# Patient Record
Sex: Female | Born: 2005 | Hispanic: No | Marital: Single | Smoking: Never smoker
Health system: Southern US, Community
[De-identification: ages and names within clinical notes are randomized; demographics above are authoritative.]

## PROBLEM LIST (undated history)

## (undated) DIAGNOSIS — R6250 Unspecified lack of expected normal physiological development in childhood: Secondary | ICD-10-CM

## (undated) DIAGNOSIS — Z91018 Allergy to other foods: Secondary | ICD-10-CM

## (undated) DIAGNOSIS — J309 Allergic rhinitis, unspecified: Secondary | ICD-10-CM

## (undated) DIAGNOSIS — J45909 Unspecified asthma, uncomplicated: Secondary | ICD-10-CM

## (undated) DIAGNOSIS — L209 Atopic dermatitis, unspecified: Secondary | ICD-10-CM

## (undated) HISTORY — DX: Unspecified lack of expected normal physiological development in childhood: R62.50

## (undated) HISTORY — DX: Allergy to other foods: Z91.018

## (undated) HISTORY — DX: Allergic rhinitis, unspecified: J30.9

## (undated) HISTORY — DX: Unspecified asthma, uncomplicated: J45.909

## (undated) HISTORY — DX: Atopic dermatitis, unspecified: L20.9

---
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
## 2023-04-12 ENCOUNTER — Ambulatory Visit: Payer: Self-pay | Admitting: Pediatrics

## 2023-04-18 ENCOUNTER — Other Ambulatory Visit: Payer: Self-pay | Admitting: Family

## 2023-04-18 ENCOUNTER — Telehealth: Payer: Self-pay | Admitting: *Deleted

## 2023-04-18 MED ORDER — LISDEXAMFETAMINE DIMESYLATE 30 MG PO CAPS: 30.0000 mg | ORAL_CAPSULE | Freq: Every day | ORAL | 0 refills | Status: DC

## 2023-04-18 NOTE — Telephone Encounter (Signed)
Mom said they had changed to Adderal because Vyvanse was out of stock.

## 2023-04-18 NOTE — Telephone Encounter (Signed)
Glender's mother is requesting refill for Vyvanse. Video Appointment made with Neysa Bonito for [facility] 04/25/23.

## 2023-04-22 ENCOUNTER — Ambulatory Visit: Payer: Self-pay | Admitting: Pediatrics

## 2023-04-25 ENCOUNTER — Telehealth: Payer: Medicaid Other | Admitting: Family

## 2023-04-25 ENCOUNTER — Encounter: Payer: Self-pay | Admitting: Family

## 2023-04-25 DIAGNOSIS — F840 Autistic disorder: Secondary | ICD-10-CM

## 2023-04-25 DIAGNOSIS — F902 Attention-deficit hyperactivity disorder, combined type: Secondary | ICD-10-CM | POA: Diagnosis not present

## 2023-04-25 MED ORDER — AMPHETAMINE-DEXTROAMPHETAMINE 5 MG PO TABS: ORAL_TABLET | ORAL | 0 refills | Status: DC

## 2023-04-25 MED ORDER — AMPHETAMINE-DEXTROAMPHETAMINE 20 MG PO TABS: 20.0000 mg | ORAL_TABLET | Freq: Every day | ORAL | 0 refills | Status: DC

## 2023-04-25 NOTE — Progress Notes (Signed)
THIS RECORD MAY CONTAIN CONFIDENTIAL INFORMATION THAT SHOULD NOT BE RELEASED WITHOUT REVIEW OF THE SERVICE PROVIDER.  Virtual Follow-Up Visit via Video Note  I connected with Brianna [name] and guardian  on 04/25/23 at  9:00 AM EDT by a video enabled telemedicine application and verified that I am speaking with the correct person using two identifiers.   Patient/parent location: home Provider location: [facility] office    I discussed the limitations of evaluation and management by telemedicine and the availability of in person appointments.  I discussed that the purpose of this telehealth visit is to provide medical care while limiting exposure to the novel coronavirus.  The guardian expressed understanding and agreed to proceed.   Brianna [name] is a 17 y.o. female referred by [name], MD here today for follow-up of ADHD.   History was provided by the patient and legal guardian.  Supervising Physician: Dr. Theadore Nan   Plan from Last Visit:     1. ADHD (attention deficit hyperactivity disorder), combined type -continue with Adderall 10 mg  -PHQSADS and ASRS at next check-in -3 months or sooner if needed  Chief Complaint: ADHD  History of Present Illness:  -going to [facility] in Fall  -has the report but can't get it to [place] because it is not a great copy  -LMP end of May  -thinks medication is working but maybe not enough  -something in the report about her sleep  -likes the Adderall over the Vyvanse, feels it works better  -  Allergies  Allergen Reactions   Other Anaphylaxis    Tree Nuts   Shellfish-Derived Products Anaphylaxis   Egg-Derived Products    Fish Allergy    Peanut Butter Flavor    Red Dye    Shellfish Allergy    Outpatient Medications Prior to Visit  Medication Sig Dispense Refill   amphetamine-dextroamphetamine (ADDERALL) 10 MG tablet Take 1 tablet (10 mg total) by mouth daily with breakfast. 30 tablet 0   cetirizine (ZYRTEC ALLERGY) 10 MG tablet  Take 1 tablet (10 mg total) by mouth daily as needed for allergies (Can take an exyra dose during flare ups.). 60 tablet 11   CETIRIZINE HCL CHILDRENS ALRGY 1 MG/ML SOLN TAKE 1 TO 2 TEASPOONSFUL BY MOUTH EVERY DAY AS NEEDED 300 mL 0   cromolyn (OPTICROM) 4 % ophthalmic solution Place 1 drop into both eyes 4 (four) times daily as needed. 10 mL 5   EPINEPHrine 0.3 mg/0.3 mL IJ SOAJ injection Inject 0.3 mg into the muscle as needed for anaphylaxis. Use as directed for life-threatening allergic reactions. 4 each 2   fluticasone (FLONASE) 50 MCG/ACT nasal spray USE 1 SPRAY IN EACH NOSTRIL 3-7 TIMES A WEEK AS NEEDED. 16 mL 0   fluticasone (FLOVENT HFA) 44 MCG/ACT inhaler USE THREE PUFFS THREE TIMES DAILY FOR FLARE-UP. RINSE, GARGLE, AND SPIT AFTER USE. 10.6 each 5   guanFACINE (INTUNIV) 1 MG TB24 ER tablet Take 1 tablet (1 mg total) by mouth daily. 90 tablet 0   lisdexamfetamine (VYVANSE) 30 MG capsule Take 1 capsule (30 mg total) by mouth daily with breakfast. 30 capsule 0   montelukast (SINGULAIR) 10 MG tablet TAKE 1 TABLET BY MOUTH EVERYDAY AT BEDTIME 30 tablet 11   PROAIR HFA 108 (90 Base) MCG/ACT inhaler INHALE TWO PUFFS EVERY FOUR TO SIX HOURS AS NEEDED FOR COUGH OR WHEEZE. 18 g 2   Spacer/Aero-Holding Chambers (AEROCHAMBER W/FLOWSIGNAL) inhaler 1 each.     triamcinolone ointment (KENALOG) 0.1 %  Apply 1 application topically daily. 453.6 g 1   No facility-administered medications prior to visit.     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  Visual Observations/Objective:   General Appearance: Well nourished well developed, in no apparent distress.  Eyes: conjunctiva no swelling or erythema ENT/Mouth: No hoarseness, No cough for duration of visit.  Neck: Supple  Respiratory: Respiratory effort normal, normal rate, no retractions or distress.   Cardio: Appears well-perfused, noncyanotic Musculoskeletal: no obvious deformity Skin: visible skin without rashes, ecchymosis, erythema Neuro: Awake and oriented X 3,  Psych:  normal affect, Insight and Judgment appropriate.    Assessment/Plan: 1. ADHD (attention deficit hyperactivity disorder), combined type 2. Autism spectrum disorder -Adderall 20 mg in AM dose with breakfast  -trial Adderall 5 mg dose with afternoons as needed  -no Intuniv, no Vyvanse  -return in 2 weeks  -get report from [place] office - will route chart to Rowland Lathe to assist with ROI/obtaining report    I discussed the assessment and treatment plan with the patient and/or parent/guardian.  They were provided an opportunity to ask questions and all were answered.  They agreed with the plan and demonstrated an understanding of the instructions. They were advised to call back or seek an in-person evaluation in the emergency room if the symptoms worsen or if the condition fails to improve as anticipated.   Follow-up:   2 weeks video or sooner if needed    Georges Mouse, NP    CC: [name], MD, [name], MD

## 2023-04-29 ENCOUNTER — Telehealth: Payer: Self-pay

## 2023-04-29 NOTE — Telephone Encounter (Signed)
Cleo to email a copy of completed psychological evaluation.

## 2023-05-09 ENCOUNTER — Encounter: Payer: Self-pay | Admitting: Family

## 2023-05-09 ENCOUNTER — Telehealth (INDEPENDENT_AMBULATORY_CARE_PROVIDER_SITE_OTHER): Payer: MEDICAID | Admitting: Family

## 2023-05-09 DIAGNOSIS — F840 Autistic disorder: Secondary | ICD-10-CM | POA: Diagnosis not present

## 2023-05-09 DIAGNOSIS — F902 Attention-deficit hyperactivity disorder, combined type: Secondary | ICD-10-CM

## 2023-05-09 NOTE — Progress Notes (Signed)
THIS RECORD MAY CONTAIN CONFIDENTIAL INFORMATION THAT SHOULD NOT BE RELEASED WITHOUT REVIEW OF THE SERVICE PROVIDER.  Virtual Follow-Up Visit via Video Note  I connected with Brianna [name] and mother  on 05/09/23 at  8:00 AM EDT by a video enabled telemedicine application and verified that I am speaking with the correct person using two identifiers.   Patient/parent location: home Provider location: remote [place]   I discussed the limitations of evaluation and management by telemedicine and the availability of in person appointments.  I discussed that the purpose of this telehealth visit is to provide medical care while limiting exposure to the novel coronavirus.  The mother expressed understanding and agreed to proceed.   Brianna [name] is a 17 y.o. female referred by [name], MD here today for follow-up of ADHd, combined type, ASD.   History was provided by the patient and mother.  Supervising Physician: Dr. Theadore Nan   Plan from Last Visit:   1. ADHD (attention deficit hyperactivity disorder), combined type 2. Autism spectrum disorder -Adderall 20 mg in AM dose with breakfast  -trial Adderall 5 mg dose with afternoons as needed  -no Intuniv, no Vyvanse  -return in 2 weeks  -get report from [facility] office - will route chart to [facility] to assist with ROI/obtaining report     Chief Complaint: ADHD  History of Present Illness:  -delay from pharmacy in getting the medication; has been taking it for about a week now  -notices it is stronger and working better than Vvyanse  -has not tried the afternoon dose but is going on vacation next week so she will try it then  -getting a lot of support through school; Mrs Ramaswamy will advise if school needs additional letter or updated report findings from [facility].   ADHD Medication Side Effects: Sleep problems: no Loss of appetite: no Abdominal pain: no Headache: no Irritability: no Heart Palpitations:  no Tics: no    Allergies  Allergen Reactions   Other Anaphylaxis    Tree Nuts   Shellfish-Derived Products Anaphylaxis   Egg-Derived Products    Fish Allergy    Peanut Butter Flavor    Red Dye    Shellfish Allergy    Outpatient Medications Prior to Visit  Medication Sig Dispense Refill   amphetamine-dextroamphetamine (ADDERALL) 20 MG tablet Take 1 tablet (20 mg total) by mouth daily with breakfast. 30 tablet 0   amphetamine-dextroamphetamine (ADDERALL) 5 MG tablet Take 5 mg tablet by mouth with food or snack as needed for afternoon dose. 30 tablet 0   cetirizine (ZYRTEC ALLERGY) 10 MG tablet Take 1 tablet (10 mg total) by mouth daily as needed for allergies (Can take an exyra dose during flare ups.). 60 tablet 11   CETIRIZINE HCL CHILDRENS ALRGY 1 MG/ML SOLN TAKE 1 TO 2 TEASPOONSFUL BY MOUTH EVERY DAY AS NEEDED 300 mL 0   cromolyn (OPTICROM) 4 % ophthalmic solution Place 1 drop into both eyes 4 (four) times daily as needed. 10 mL 5   EPINEPHrine 0.3 mg/0.3 mL IJ SOAJ injection Inject 0.3 mg into the muscle as needed for anaphylaxis. Use as directed for life-threatening allergic reactions. 4 each 2   fluticasone (FLONASE) 50 MCG/ACT nasal spray USE 1 SPRAY IN EACH NOSTRIL 3-7 TIMES A WEEK AS NEEDED. 16 mL 0   fluticasone (FLOVENT HFA) 44 MCG/ACT inhaler USE THREE PUFFS THREE TIMES DAILY FOR FLARE-UP. RINSE, GARGLE, AND SPIT AFTER USE. 10.6 each 5   montelukast (SINGULAIR) 10  MG tablet TAKE 1 TABLET BY MOUTH EVERYDAY AT BEDTIME 30 tablet 11   PROAIR HFA 108 (90 Base) MCG/ACT inhaler INHALE TWO PUFFS EVERY FOUR TO SIX HOURS AS NEEDED FOR COUGH OR WHEEZE. 18 g 2   Spacer/Aero-Holding Chambers (AEROCHAMBER W/FLOWSIGNAL) inhaler 1 each.     triamcinolone ointment (KENALOG) 0.1 % Apply 1 application topically daily. 453.6 g 1   No facility-administered medications prior to visit.     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   Food allergy 06/18/2015   Asthma, mild persistent 07/08/2014   Seasonal allergic rhinitis 07/08/2014   Eczema 07/08/2014   Bunion 07/08/2014   ADHD (attention deficit hyperactivity disorder), combined type 08/17/2013   Learning disability 08/17/2013   Language disorder involving understanding and expression of language 08/17/2013   Encopresis 07/06/2013   Adopted 07/06/2013     The following portions of the patient's history were reviewed and updated as appropriate: allergies, current medications, past family history, past medical history, past social history, past surgical history, and problem list.  Visual Observations/Objective:   General Appearance: Well nourished well developed, in no apparent distress.  Eyes: conjunctiva no swelling or erythema ENT/Mouth: No hoarseness, No cough for duration of visit.  Neck: Supple  Respiratory: Respiratory effort normal, normal rate, no retractions or distress.   Cardio: Appears well-perfused, noncyanotic Musculoskeletal: no obvious deformity Skin: visible skin without rashes, ecchymosis, erythema Neuro: Awake and oriented X 3,  Psych:  normal affect, Insight and Judgment appropriate.    Assessment/Plan: 1. ADHD (attention deficit hyperactivity disorder), combined type 2. Autism spectrum disorder -continue with Adderall XR 20 mg and Adderall 5 mg as needed for afternoon  -advise if letter needed for accommodations or additional supports with school  -otherwise every 3 moths or sooner for med checks    I discussed the assessment and treatment plan with the patient and/or parent/guardian.  They were provided an opportunity to ask questions and all were answered.  They agreed with the plan and demonstrated an understanding of the instructions. They were advised to call back or seek an in-person evaluation in the  emergency room if the symptoms worsen or if the condition fails to improve as anticipated.   Follow-up:   3 months in person or video    Georges Mouse, NP    CC: [name], MD, [name], MD

## 2023-06-10 ENCOUNTER — Other Ambulatory Visit: Payer: Self-pay | Admitting: Internal Medicine

## 2023-06-10 ENCOUNTER — Telehealth: Payer: Self-pay | Admitting: *Deleted

## 2023-06-10 NOTE — Telephone Encounter (Signed)
Geni request Adderall refill on the refill line.

## 2023-06-11 ENCOUNTER — Encounter: Payer: Self-pay | Admitting: Family

## 2023-06-11 ENCOUNTER — Other Ambulatory Visit: Payer: Self-pay | Admitting: Family

## 2023-06-11 MED ORDER — AMPHETAMINE-DEXTROAMPHETAMINE 20 MG PO TABS: 20.0000 mg | ORAL_TABLET | Freq: Every day | ORAL | 0 refills | Status: DC

## 2023-06-11 MED ORDER — AMPHETAMINE-DEXTROAMPHETAMINE 5 MG PO TABS: ORAL_TABLET | ORAL | 0 refills | Status: DC

## 2023-07-03 ENCOUNTER — Other Ambulatory Visit: Payer: Self-pay | Admitting: Allergy and Immunology

## 2023-07-03 NOTE — Telephone Encounter (Signed)
Called and spoke to mom and informed her that the medication has been sent in and we also schedule her yearly follow up for.

## 2023-07-18 ENCOUNTER — Other Ambulatory Visit (HOSPITAL_COMMUNITY)
Admission: RE | Admit: 2023-07-18 | Discharge: 2023-07-18 | Disposition: A | Discharge status: Home / Self Care | Payer: MEDICAID | Source: Ambulatory Visit | Attending: Pediatrics | Admitting: Pediatrics

## 2023-07-18 ENCOUNTER — Ambulatory Visit (INDEPENDENT_AMBULATORY_CARE_PROVIDER_SITE_OTHER): Payer: MEDICAID

## 2023-07-18 VITALS — BP 122/78 | HR 111 | Ht 64.96 in | Wt 137.6 lb

## 2023-07-18 DIAGNOSIS — Z113 Encounter for screening for infections with a predominantly sexual mode of transmission: Secondary | ICD-10-CM | POA: Insufficient documentation

## 2023-07-18 DIAGNOSIS — Z0000 Encounter for general adult medical examination without abnormal findings: Secondary | ICD-10-CM

## 2023-07-18 DIAGNOSIS — F909 Attention-deficit hyperactivity disorder, unspecified type: Secondary | ICD-10-CM

## 2023-07-18 DIAGNOSIS — Z7187 Encounter for pediatric-to-adult transition counseling: Secondary | ICD-10-CM | POA: Diagnosis not present

## 2023-07-18 DIAGNOSIS — Z114 Encounter for screening for human immunodeficiency virus [HIV]: Secondary | ICD-10-CM

## 2023-07-18 DIAGNOSIS — J4520 Mild intermittent asthma, uncomplicated: Secondary | ICD-10-CM

## 2023-07-18 DIAGNOSIS — Z91018 Allergy to other foods: Secondary | ICD-10-CM

## 2023-07-18 DIAGNOSIS — Z68.41 Body mass index (BMI) pediatric, 5th percentile to less than 85th percentile for age: Secondary | ICD-10-CM | POA: Diagnosis not present

## 2023-07-18 LAB — POCT HEMOGLOBIN: Hemoglobin: 14.5 g/dL (ref 11–14.6)

## 2023-07-18 LAB — POCT RAPID HIV: Rapid HIV, POC: NEGATIVE

## 2023-07-18 MED ORDER — ALBUTEROL SULFATE HFA 108 (90 BASE) MCG/ACT IN AERS: 2.0000 | INHALATION_SPRAY | Freq: Four times a day (QID) | RESPIRATORY_TRACT | 2 refills | Status: DC | PRN

## 2023-07-18 MED ORDER — ALBUTEROL SULFATE HFA 108 (90 BASE) MCG/ACT IN AERS
2.0000 | INHALATION_SPRAY | RESPIRATORY_TRACT | 2 refills | Status: DC | PRN
Start: 2023-07-18 — End: 2023-10-29

## 2023-07-18 MED ORDER — EPINEPHRINE 0.3 MG/0.3ML IJ SOAJ
0.3000 mg | INTRAMUSCULAR | 2 refills | Status: DC | PRN
Start: 2023-07-18 — End: 2023-10-29

## 2023-07-18 MED ORDER — AMPHETAMINE-DEXTROAMPHETAMINE 20 MG PO TABS
20.0000 mg | ORAL_TABLET | Freq: Every day | ORAL | 0 refills | Status: DC
Start: 2023-07-18 — End: 2023-07-24

## 2023-07-18 MED ORDER — EPINEPHRINE 0.3 MG/0.3ML IJ SOAJ: 0.3000 mg | INTRAMUSCULAR | 2 refills | Status: DC | PRN

## 2023-07-18 NOTE — Patient Instructions (Addendum)
Your hemoglobin is normal today; please continue your daily multivitamin with iron.  Please get your annual flu shot and Covid booster at your preferred pharmacy.  [facility]  Please let me know once you have been accepted for adult medical care at your mom's doctor's office or place of your choice.  We want to accomplish this before your birthday  Please call or reach out in MyChart if you have questions.  HAVE A GREAT YEAR AT COLLEGE.

## 2023-07-18 NOTE — Progress Notes (Addendum)
Adolescent Well Care Visit Brianna [name] is a 17 y.o. female who is here for well care.    PCP:  Brianna Erie, MD   History was provided by the patient.  Confidentiality was discussed with the patient and, if applicable, with caregiver as well.  Current Issues: Current concerns include doing well overall no concerns at this time.  Nutrition: Nutrition/Eating Behaviors: vegetables, meat including chicken and steak, fruits, some fast food occasionally.  Adequate calcium in diet?: Milk with breakfast, yogurt, cheese  Supplements/ Vitamins: Vitamin D, C and Elderberry- taking in moderation and as needed   Exercise/ Media: Play any Sports?/ Exercise: goes to the gym Tuesdays and Thursday  Screen Time:  < 2 hours Media Rules or Monitoring?: no  Sleep:  Sleep: around 8 hours of sleep per night   Social Screening: Lives with:  lives alone no roommates in dorm  Parental relations:  good Activities, Work, and Regulatory affairs officer?: No, does chores like clean her dorm Concerns regarding behavior with peers?  no Stressors of note: no  Education: School Name: [employer], [employer])  School Grade: [employer] performance: doing well; no concerns School Behavior: doing well; no concerns  Menstruation:   Patient's last menstrual period was 06/29/2023 (approximate). Menstrual History: Towards the end of the month    Confidential Social History: Tobacco?  no Secondhand smoke exposure?  no Drugs/ETOH?  no  Sexually Active?  No Pregnancy Prevention: None needed at this time, discussed desire for obtaining contraception, patient declined at this time.   Safe at home, in school & in relationships?  Yes Safe to self?  No - none    Screenings: Patient has a dental home: yes  PHQ-9 and RAPS Screening: Not performed at today's visit as patient was not given those questionnaires.  The following topics were discussed as part of anticipatory guidance healthy eating, exercise, tobacco use,  marijuana use, drug use, condom use, birth control, mental health issues, and school problems. Patient stated no current adjustment problems or depressed ideation at this time.   The patient completed the Transition Skills Assessment for Young Adults screening questionnaire and the following topics were identified as learning needs and discussed:  Patient states confidence in managing healthcare for all 13 questions asked.   Physical Exam:  Vitals:   07/18/23 0845 07/18/23 0936  BP: 130/80 122/78  Pulse: (!) 111   SpO2: 98%   Weight: 137 lb 9.6 oz (62.4 kg)   Height: 5' 4.96" (1.65 m)    BP 122/78 (BP Location: Right Arm, Patient Position: Sitting, Cuff Size: Normal)   Pulse (!) 111   Ht 5' 4.96" (1.65 m)   Wt 137 lb 9.6 oz (62.4 kg)   LMP 06/29/2023 (Approximate)   SpO2 98%   BMI 22.93 kg/m  Body mass index: body mass index is 22.93 kg/m. Blood pressure %iles are not available for patients who are 18 years or older.  Vision Screening   Right eye Left eye Both eyes  Without correction 20/16 20/16 20/16   With correction     Comments: Glasses are home    General Appearance:   alert, oriented, no acute distress and well nourished  HENT: Normocephalic, no obvious abnormality, conjunctiva clear, pupils equal and reactive to light   Mouth:   Normal appearing teeth, no obvious discoloration, dental caries, or dental caps  Neck:   Supple; thyroid: no enlargement, symmetric, no tenderness/mass/nodules  Chest Normal development, no rashes or lesions  Lungs:   Clear to auscultation  bilaterally, normal work of breathing  Heart:   Regular rate and rhythm, S1 and S2 normal, no murmurs;   Abdomen:   Soft, non-tender, no mass, or organomegaly, no distention or rebound tenderness, no guarding    GU genitalia not examined  Musculoskeletal:   Tone and strength strong and symmetrical, all extremities               Lymphatic:   No cervical adenopathy  Skin/Hair/Nails:   Skin warm, dry and  intact, no rashes, no bruises or petechiae  Neurologic:   Strength, gait, and coordination normal and age-appropriate   Results for orders placed or performed in visit on 07/18/23 (from the past 48 hour(s))  POCT Rapid HIV     Status: Normal   Collection Time: 07/18/23  9:25 AM  Result Value Ref Range   Rapid HIV, POC Negative   POCT hemoglobin     Status: Normal   Collection Time: 07/18/23  9:40 AM  Result Value Ref Range   Hemoglobin 14.5 11 - 14.6 g/dL     Assessment and Plan:   Brianna [name] is a 17 year old female who presents to the clinic today for her annual well check. 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 No acute concerns per patient at this time. Discussed and addressed the following topics at today's visit, please see below.  Assessment & Plan   1. Encounter for general adult medical examination without abnormal findings -POCT hemoglobin -Hearing screening result:normal -Vision screening result: normal -Counseling provided for all of the vaccine components, patient up to date on vaccinations. Discussed obtaining Flu Vacc[facility] and [facility] other [facility] since patient is 17 years of age cannot receive in office.  -Refilled patient's albuterol for mild asthma and Epi Pen for egg allergy at today's visit.  2. Body mass index (BMI) of 5th to less than 85th percentile for age in patient less than 62 years of age -Patient's weight and BMI appropriate for age  -Discussed incorporating vegetables, fruits, proteins and grains in diet, especially now that she is in college, patient agreed to plan  -Patient exercises and goes to the gym Tuesday/Thursday, discussed continuing this plan  3. Screening examination for venereal disease -Urine cytology ancillary only  4. Screening for human immunodeficiency virus -POCT Rapid HIV, results are negative   5. Attention deficit disorder with hyperactivity -Patient  taking Adderall daily, is seeing psychology in [facility] who is closely following patient for this -Patient endorses medication helping her, taking daily, plan to continue refilled order at today's visit  6. Routine screening for STI (sexually transmitted infection) -Patient denies being sexually active at this time, discussed using protection when/if decides to become sexually active and use of birth control when she is ready.  -Patient denies wanting birth control contraception at this time.   Orders Placed This Encounter  Procedures   POCT Rapid HIV   POCT hemoglobin  Meds sent today: - amphetamine-dextroamphetamine (ADDERALL) 20 MG tablet; Take 1 tablet (20 mg total) by mouth daily with breakfast.  Dispense: 30 tablet; Refill: 0 - EPINEPHrine 0.3 mg/0.3 mL IJ SOAJ injection; Inject 0.3 mg into the muscle as needed for anaphylaxis. Use as directed for life-threatening allergic reactions.  Dispense: 4 each; Refill: 2 - albuterol (VENTOLIN HFA) 108 (90 Base) MCG/ACT inhaler; Inhale 2 puffs into the lungs every 4 (four) hours as needed for wheezing or shortness of breath.  Dispense: 1 each; Refill: 2     Return in about 1 year (around 07/17/2024).Arlyce Harman, MD

## 2023-07-19 LAB — URINE CYTOLOGY ANCILLARY ONLY
Chlamydia: NEGATIVE
Comment: NEGATIVE
Comment: NORMAL
Neisseria Gonorrhea: NEGATIVE

## 2023-07-21 ENCOUNTER — Encounter: Payer: Self-pay | Admitting: Family

## 2023-07-24 ENCOUNTER — Other Ambulatory Visit: Payer: Self-pay | Admitting: Family

## 2023-07-24 ENCOUNTER — Telehealth: Payer: Self-pay

## 2023-07-24 ENCOUNTER — Encounter: Payer: Self-pay | Admitting: Pediatrics

## 2023-07-24 DIAGNOSIS — F909 Attention-deficit hyperactivity disorder, unspecified type: Secondary | ICD-10-CM

## 2023-07-24 MED ORDER — AMPHETAMINE-DEXTROAMPHETAMINE 20 MG PO TABS: 20.0000 mg | ORAL_TABLET | Freq: Every day | ORAL | 0 refills | Status: DC

## 2023-07-24 NOTE — Telephone Encounter (Signed)
Patient unable to get Adderall 20 mg due to insurance not recognizing the prescriber (may have been a resident). If [facility] could [facility] [address] Please advise, thank you.

## 2023-07-25 ENCOUNTER — Encounter: Payer: MEDICAID | Admitting: Family

## 2023-07-30 ENCOUNTER — Encounter: Payer: Self-pay | Admitting: Pediatrics

## 2023-08-01 NOTE — Telephone Encounter (Signed)
Contact is pertinent to sister.  Note in sister's chart.

## 2023-08-09 ENCOUNTER — Telehealth: Payer: MEDICAID | Admitting: Family

## 2023-08-09 ENCOUNTER — Encounter: Payer: Self-pay | Admitting: Family

## 2023-08-09 DIAGNOSIS — F909 Attention-deficit hyperactivity disorder, unspecified type: Secondary | ICD-10-CM | POA: Diagnosis not present

## 2023-08-09 DIAGNOSIS — F840 Autistic disorder: Secondary | ICD-10-CM

## 2023-08-09 MED ORDER — AMPHETAMINE-DEXTROAMPHETAMINE 5 MG PO TABS: ORAL_TABLET | ORAL | 0 refills | Status: DC

## 2023-08-09 MED ORDER — AMPHETAMINE-DEXTROAMPHETAMINE 20 MG PO TABS: 20.0000 mg | ORAL_TABLET | Freq: Every day | ORAL | 0 refills | Status: DC

## 2023-08-09 NOTE — Progress Notes (Signed)
THIS RECORD MAY CONTAIN CONFIDENTIAL INFORMATION THAT SHOULD NOT BE RELEASED WITHOUT REVIEW OF THE SERVICE PROVIDER.  Virtual Follow-Up Visit via Video Note  I connected with Brianna [name] and mother  on 08/09/23 at  8:00 AM EDT by a video enabled telemedicine application and verified that I am speaking with the correct person using two identifiers.   Patient/parent location: home  Provider location: remote [place]    I discussed the limitations of evaluation and management by telemedicine and the availability of in person appointments.  I discussed that the purpose of this telehealth visit is to provide medical care while limiting exposure to the novel coronavirus.  The mother expressed understanding and agreed to proceed.   Brianna [name] is a 17 y.o. female referred by [name], MD here today for follow-up of ADHD, combined type, ASD.   History was provided by the patient and mother.  Supervising Physician: Dr. Theadore Nan   Plan from Last Visit:   1. ADHD (attention deficit hyperactivity disorder), combined type 2. Autism spectrum disorder -continue with Adderall XR 20 mg and Adderall 5 mg as needed for afternoon  -advise if letter needed for accommodations or additional supports with school  -otherwise every 3 moths or sooner for med checks   Chief Complaint: No concerns, refills  History of Present Illness:  -taking Adderall 20 mg at 7AM  -Adderall 5 mg at 3PM  -able to eat 3 meals daily; mom noted a slight change in appetite when school started but has picked back up now  -LMP 10/01  ADHD Medication Side Effects: Sleep problems: no Loss of appetite: no Abdominal pain: no Headache: no Irritability: no Dizziness: no Heart Palpitations: no Tics: no    Wt Readings from Last 3 Encounters:  07/18/23 137 lb 9.6 oz (62.4 kg) (68%, Z= 0.47)*  11/13/22 143 lb (64.9 kg) (77%, Z= 0.74)*  07/06/22 140 lb 12.8 oz (63.9 kg) (76%, Z= 0.70)*   * Growth  percentiles are based on CDC (Girls, 2-20 Years) data.     Allergies  Allergen Reactions   Other Anaphylaxis    Tree Nuts   Shellfish-Derived Products Anaphylaxis   Egg-Derived Products    Fish Allergy    Peanut Butter Flavor    Red Dye #40 (Allura Red)    Shellfish Allergy    Outpatient Medications Prior to Visit  Medication Sig Dispense Refill   albuterol (VENTOLIN HFA) 108 (90 Base) MCG/ACT inhaler Inhale 2 puffs into the lungs every 4 (four) hours as needed for wheezing or shortness of breath. 1 each 2   amphetamine-dextroamphetamine (ADDERALL) 20 MG tablet Take 1 tablet (20 mg total) by mouth daily with breakfast. 30 tablet 0   amphetamine-dextroamphetamine (ADDERALL) 5 MG tablet Take 5 mg tablet by mouth with food or snack as needed for afternoon dose. (Patient not taking: Reported on 07/18/2023) 30 tablet 0   cetirizine (ZYRTEC ALLERGY) 10 MG tablet Take 1 tablet (10 mg total) by mouth daily as needed for allergies (Can take an exyra dose during flare ups.). 60 tablet 11   CETIRIZINE HCL CHILDRENS ALRGY 1 MG/ML SOLN TAKE 1 TO 2 TEASPOONSFUL BY MOUTH EVERY DAY AS NEEDED 300 mL 0   cromolyn (OPTICROM) 4 % ophthalmic solution Place 1 drop into both eyes 4 (four) times daily as needed. 10 mL 5   EPINEPHrine 0.3 mg/0.3 mL IJ SOAJ injection Inject 0.3 mg into the muscle as needed for anaphylaxis. Use as directed for life-threatening allergic reactions. 4  each 2   fluticasone (FLONASE) 50 MCG/ACT nasal spray USE 1 SPRAY IN EACH NOSTRIL 3-7 TIMES A WEEK AS NEEDED. 48 mL 0   fluticasone (FLOVENT HFA) 44 MCG/ACT inhaler USE THREE PUFFS THREE TIMES DAILY FOR FLARE-UP. RINSE, GARGLE, AND SPIT AFTER USE. 10.6 each 5   montelukast (SINGULAIR) 10 MG tablet TAKE 1 TABLET BY MOUTH EVERYDAY AT BEDTIME 30 tablet 11   Spacer/Aero-Holding Chambers (AEROCHAMBER W/FLOWSIGNAL) inhaler 1 each.     triamcinolone ointment (KENALOG) 0.1 % Apply 1 application topically daily. 453.6 g 1   No  facility-administered medications prior to visit.     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   Food allergy 06/18/2015   Asthma, mild persistent 07/08/2014   Seasonal allergic rhinitis 07/08/2014   Eczema 07/08/2014   Bunion 07/08/2014   ADHD (attention deficit hyperactivity disorder), combined type 08/17/2013   Learning disability 08/17/2013   Language disorder involving understanding and expression of language 08/17/2013   Encopresis 07/06/2013   Adopted 07/06/2013   The following portions of the patient's history were reviewed and updated as appropriate: allergies, current medications, past family history, past medical history, past social history, past surgical history, and problem list.  Visual Observations/Objective:   General Appearance: Well nourished well developed, in no apparent distress.  Eyes: conjunctiva no swelling or erythema ENT/Mouth: No hoarseness, No cough for duration of visit.  Neck: Supple  Respiratory: Respiratory effort normal, normal rate, no retractions or distress.   Cardio: Appears well-perfused, noncyanotic Musculoskeletal: no obvious deformity Skin: visible skin without rashes, ecchymosis, erythema Neuro: Awake and oriented X 3,  Psych:  normal affect, Insight and Judgment appropriate.    Assessment/Plan: 1. Attention deficit disorder with hyperactivity 2. Autism spectrum disorder  -continue with Adderall XR 20 mg daily in morning and Adderall 5 mg for afternoons as needed -reviewed return precautions  -return 3 months or sooner if needed  -weight and vitals update at next check-up.   - amphetamine-dextroamphetamine (ADDERALL) 20 MG tablet; Take 1 tablet (20 mg total) by mouth daily with breakfast.  Dispense: 30 tablet; Refill:  0 - amphetamine-dextroamphetamine (ADDERALL) 5 MG tablet; Take 5 mg tablet by mouth with food or snack as needed for afternoon dose.  Dispense: 30 tablet; Refill: 0   I discussed the assessment and treatment plan with the patient and/or parent/guardian.  They were provided an opportunity to ask questions and all were answered.  They agreed with the plan and demonstrated an understanding of the instructions. They were advised to call back or seek an in-person evaluation in the emergency room if the symptoms worsen or if the condition fails to improve as anticipated.   Follow-up:   3 months in person    Georges Mouse, NP    CC: [name], MD, [name], MD

## 2023-08-12 ENCOUNTER — Telehealth: Payer: Self-pay | Admitting: Pediatrics

## 2023-08-12 NOTE — Telephone Encounter (Signed)
Called patient and left message with guardian to give [place] a call here at the office to schedule follow up appointment and sent MyChart message.

## 2023-08-12 NOTE — Telephone Encounter (Signed)
-----   Message from Georges Mouse sent at 08/09/2023  8:35 AM EDT ----- 3 months in person

## 2023-08-28 ENCOUNTER — Encounter: Payer: Self-pay | Admitting: *Deleted

## 2023-08-28 ENCOUNTER — Ambulatory Visit (INDEPENDENT_AMBULATORY_CARE_PROVIDER_SITE_OTHER): Payer: MEDICAID | Admitting: Clinical

## 2023-08-28 ENCOUNTER — Other Ambulatory Visit: Payer: Self-pay | Admitting: Family

## 2023-08-28 ENCOUNTER — Encounter: Payer: Self-pay | Admitting: Family

## 2023-08-28 DIAGNOSIS — F4329 Adjustment disorder with other symptoms: Secondary | ICD-10-CM

## 2023-08-28 DIAGNOSIS — F902 Attention-deficit hyperactivity disorder, combined type: Secondary | ICD-10-CM

## 2023-08-28 DIAGNOSIS — F840 Autistic disorder: Secondary | ICD-10-CM

## 2023-08-28 NOTE — BH Specialist Note (Signed)
Integrated Behavioral Health via Telemedicine Visit  08/28/2023 FRIMMY FAROOQUI [id number]  Number of Integrated Behavioral Health Clinician visits: 1- Initial Visit  Session Start time: 1512  Session End time: 1610  Total time in minutes: 58   Referring Provider: Dr. Duffy Rhody Patient/Family location: Pt's home [facility] Provider location: [facility] Office All persons participating in visit: Patient, Ms. Arth & this [facility] Types of Service: Individual psychotherapy and Video visit  I connected with [name]  via  Telephone or Video Enabled Telemedicine Application  (Video is Caregility application) and verified that I am speaking with the correct person using two identifiers. Discussed confidentiality: Yes   I discussed the limitations of telemedicine and the availability of in person appointments.  Discussed there is a possibility of technology failure and discussed alternative modes of communication if that failure occurs.  I discussed that engaging in this telemedicine visit, they consent to the provision of behavioral healthcare and the services will be billed under their insurance.  Patient and/or legal guardian expressed understanding and consented to Telemedicine visit: Yes   Presenting Concerns: Patient and/or family reports the following symptoms/concerns:  - adjusting going to [facility] and being more independent  Duration of problem: weeks; Severity of problem: moderate  Patient and/or Family's Strengths/Protective Factors: Social connections, Concrete supports in place (healthy food, safe environments, etc.), and Parental Resilience  Goals Addressed: Patient will:  Increase knowledge and/or ability of: self-management skills, starting with morning routines.  Demonstrate ability to: Increase adequate support systems for patient/family  Progress towards Goals: Ongoing  Interventions: Interventions utilized:  Solution-Focused Strategies and Link to [employer] Standardized Assessments completed: Not Needed  Patient and/or Family Response:  Jaretzy reported the following social connections since her transition into college. Enjoys math club Attends events, seminars, workshops With group of friends from high school Has also met new people in college  Current supports through the college that Noralee is aware of: [employer] - Production manager, Stage manager, extended time, activities [employer] - Accommodations, eg. living in her own room (Office of Clinical biochemist)  Previous supports: Last time she's seen therapist 2016 - due to getting a fight in [facility] acknowledged that she's learned in the past about boundaries and types of information to share with different people.  Kathrin reported who she can share personal information with and who not to share personal information with but may still have difficulties when it comes to sharing things online.   Mother is still concerned about Nieve's ability to understand boundaries and implement daily self-care routines in the morning. Mother reported Keiondra was trying to apply for a stocks account and was going to give out her social security number but her older sister found out and was able to stop her.  Mother concerned with Cheronda's daily self-care and social interactions.   Mother is aware of [employer] resources - how to take care of themselves on a daily basis.   Mother wants patient to understand that she can't force herself on other people, understanding Mariene's boundaries with others. Ongoing concerns with support for patient's disability - going through the process through [facility] for the last 4 years, finances stopped when she was 17 years old. Daily care, monthly care with menstruation - they feel they have to bring her home with that.  Identified solutions to assist Meya with daily self-care with the use of visual check lists.  Mother reported she can  try that again since it worked  in the past.    Mother will also look at primary care doctors and discuss options with Sudie about hormone treatment for menstruation. Mother reported she has asked about it in the past and will discuss it further with [name] and medical providers.  Assessment: Patient currently experiencing challenges with transition to being more independent in college.  Meliya and her mother are aware of additional supports and resources available to [name] at [facility], however, Ivana reported that she hasn't been able to access it due to limited time.     Patient may benefit from implementing solutions that include visual aides/reminders to assist Mariell with her daily tasks.  She would also benefit from consulting with C. Jones,FNP or PCP regarding options for hormone therapy for menstruation.  Plan: Follow up with behavioral health clinician on : 09/16/23 Behavioral recommendations:  - Implement visual lists/aides for daily morning routines so Baneen will remember to do it - Talk to PCP Or Beatriz Stallion, FNP regarding options for hormone treatment for menstruation  Referral(s):  Community resources for adult primary care  I discussed the assessment and treatment plan with the patient and/or parent/guardian. They were provided an opportunity to ask questions and all were answered. They agreed with the plan and demonstrated an understanding of the instructions.   They were advised to call back or seek an in-person evaluation if the symptoms worsen or if the condition fails to improve as anticipated.  Jaxton Casale Ed Blalock, LCSW

## 2023-09-16 ENCOUNTER — Ambulatory Visit (INDEPENDENT_AMBULATORY_CARE_PROVIDER_SITE_OTHER): Payer: MEDICAID | Admitting: Clinical

## 2023-09-16 DIAGNOSIS — F4329 Adjustment disorder with other symptoms: Secondary | ICD-10-CM

## 2023-09-16 NOTE — BH Specialist Note (Signed)
Integrated Behavioral Health via Telemedicine Visit  09/16/2023 Bri[name] [id number]  Number of Integrated Behavioral Health Clinician visits: 2- Second Visit  Session Start time: 1519  Session End time: 1537  Total time in minutes: 18   Referring Provider: Dr. Duffy Rhody Patient/Family location: Pt's dorm room at [name] Provider location: [name] All persons participating in visit: Rozella & this [name] Types of Service: Individual psychotherapy and Video visit  I connected with Brianna Millet via  Telephone or Video Enabled Telemedicine Application  (Video is Caregility application) and verified that I am speaking with the correct person using two identifiers. Discussed confidentiality: Yes   I discussed the limitations of telemedicine and the availability of in person appointments.  Discussed there is a possibility of technology failure and discussed alternative modes of communication if that failure occurs.  I discussed that engaging in this telemedicine visit, they consent to the provision of behavioral healthcare and the services will be billed under their insurance.  Patient and/or legal guardian expressed understanding and consented to Telemedicine visit: Yes   Presenting Concerns: Patient and/or family reports the following symptoms/concerns:  - no specific concerns, focused on getting her school assignments done for the semester Duration of problem: weeks; Severity of problem: mild  Patient and/or Family's Strengths/Protective Factors: Concrete supports in place (healthy food, safe environments, etc.) and Sense of purpose  Goals Addressed: Patient will:  Increase knowledge and/or ability of: self-management skills, starting with morning routines.  Demonstrate ability to: Increase adequate support systems for patient/family  Progress towards Goals: Discontinued  Interventions: Interventions utilized:  Solution-Focused Strategies and Link to  [employer] Assessments completed: Not Needed  Patient and/or Family Response:  Bri[name] reviewed mother's concerns with [name] regarding daily self-care, including menstruation.  [name] was open to talking with Dr. Duffy Rhody or Beatriz Stallion, FNP about hormone treatment since she usually gets pain with her menstruation.  She said she will contact them directly when [name] offered to schedule an appointment.  Brianna [name] reported that she does well with her morning routines and self-care.  She doesn't think she needs additional help or support at this time.  Brianna [name] was open to having a list of counseling agencies if she changes her mind about counseling.  [name] will also provide info to pt's mother with Brianna [name]'s permission, as well as the options for adult primary care.  Assessment: Patient currently experiencing ongoing adjustment with being more independent in college.  [name] is aware of her resources and supports at the university.  Brianna [name] has a strong support system with her family.  Family may want [name] to be more independent at this time in her daily activities.   Patient may benefit from continuing to reach out to the services available to her on campus and her healthcare team.  Plan: Follow up with behavioral health clinician on : No follow up scheduled since [name] doesn't think she needs behavioral health services at this time. Behavioral recommendations:   Send info to pt's mother, [email or web address] - send the adult primary care info. Send counseling agencies to both patient/mother. (Include info about [employer] options for counseling) Sent information about counseling & adult primary care options to mother via email.   I discussed the assessment and treatment plan with the patient and/or parent/guardian. They were provided an opportunity to ask questions and all were answered. They agreed with the plan and demonstrated an understanding of the instructions.   They  were advised to call back or seek  an in-person evaluation if the symptoms worsen or if the condition fails to improve as anticipated.  Nayib Remer Ed Blalock, LCSW

## 2023-10-29 ENCOUNTER — Ambulatory Visit (INDEPENDENT_AMBULATORY_CARE_PROVIDER_SITE_OTHER): Payer: MEDICAID | Admitting: Allergy and Immunology

## 2023-10-29 ENCOUNTER — Other Ambulatory Visit: Payer: Self-pay

## 2023-10-29 VITALS — BP 118/81 | HR 86 | Temp 98.2°F | Ht 64.75 in | Wt 139.5 lb

## 2023-10-29 DIAGNOSIS — J301 Allergic rhinitis due to pollen: Secondary | ICD-10-CM | POA: Diagnosis not present

## 2023-10-29 DIAGNOSIS — J4520 Mild intermittent asthma, uncomplicated: Secondary | ICD-10-CM

## 2023-10-29 DIAGNOSIS — J4530 Mild persistent asthma, uncomplicated: Secondary | ICD-10-CM | POA: Diagnosis not present

## 2023-10-29 DIAGNOSIS — J3089 Other allergic rhinitis: Secondary | ICD-10-CM

## 2023-10-29 DIAGNOSIS — H1013 Acute atopic conjunctivitis, bilateral: Secondary | ICD-10-CM | POA: Diagnosis not present

## 2023-10-29 DIAGNOSIS — Z91018 Allergy to other foods: Secondary | ICD-10-CM

## 2023-10-29 DIAGNOSIS — L2089 Other atopic dermatitis: Secondary | ICD-10-CM

## 2023-10-29 MED ORDER — MONTELUKAST SODIUM 10 MG PO TABS: 10.0000 mg | ORAL_TABLET | Freq: Every evening | ORAL | 3 refills | Status: DC

## 2023-10-29 MED ORDER — EPINEPHRINE 0.3 MG/0.3ML IJ SOAJ: 0.3000 mg | INTRAMUSCULAR | 2 refills | Status: DC | PRN

## 2023-10-29 MED ORDER — CROMOLYN SODIUM 4 % OP SOLN: 1.0000 [drp] | Freq: Four times a day (QID) | OPHTHALMIC | 3 refills | Status: AC | PRN

## 2023-10-29 MED ORDER — FLUTICASONE PROPIONATE 50 MCG/ACT NA SUSP: 1.0000 | Freq: Every day | NASAL | 3 refills | Status: DC

## 2023-10-29 MED ORDER — ALBUTEROL SULFATE HFA 108 (90 BASE) MCG/ACT IN AERS: 2.0000 | INHALATION_SPRAY | RESPIRATORY_TRACT | 2 refills | Status: DC | PRN

## 2023-10-29 MED ORDER — CETIRIZINE HCL 10 MG PO TABS: 10.0000 mg | ORAL_TABLET | Freq: Every day | ORAL | 3 refills | Status: DC | PRN

## 2023-10-29 MED ORDER — TRIAMCINOLONE ACETONIDE 0.1 % EX OINT: 1.0000 | TOPICAL_OINTMENT | Freq: Every day | CUTANEOUS | 1 refills | Status: DC

## 2023-10-29 NOTE — Patient Instructions (Addendum)
  1. If needed:   A. EpiPen , Benadryl, M.D./ER evaluation for allergic reaction  B. Zyrtec  10 mg tablet one time per day  C. Albuterol +Fluticasone  44 - 2 inhalations TOGETHER every 6 hours  D. Cromolyn  one drop each eye 1-4 times a day  E. Triamcinolone  0.1%/Eucerin-  applied to eczema 1 time per day  F.  Flonase  - 1-2 sprays each nostril 3-7 times a week   G. Montelukast  10 mg - 1 tablet 1 time a day  2. Return to clinic in 12 months or earlier if problem  3. Obtain fall flu vaccine  4. Influenza = Tamiflu. Covid = Paxlovid

## 2023-10-29 NOTE — Progress Notes (Signed)
  - [place] [facility] [place] - [place] [facility] [place]   Follow-up Note  Referring Provider: Taft Jon PARAS, MD Primary Provider: Taft Jon PARAS, MD Date of Office Visit: 10/29/2023  Subjective:   Brianna [name] (DOB: 07-31-2006) is a 17 y.o. female [facility] in re-evaluation of the following:  HPI: Brianna [name] returns to this clinic in evaluation of asthma, allergic rhinitis, food allergy directed against tree nut, peanut, shellfish.  I last saw her in this clinic 13 November 2022.  She has really done well regarding her airway since I have last seen her in this clinic and has not required a systemic steroid or antibiotic for any type of airway issue while intermittently using some nasal steroids and montelukast  and an inhaled steroid along with a short acting bronchodilator.  For the most part she can exercise without any problem and does not have any cold air induced bronchospastic symptoms and can smell and taste with no problem.  There was an episode in September 2024 when she had runny nose and congestion and cough for which she activated her action plan and this issue resolved within 1 week and did not require any further evaluation or treatment.  Her skin has really been doing very well with intermittent use of topical triamcinolone .  She remains away from consumption of tree nuts and peanuts and shellfish.  She has not had the flu vaccine yet.  Allergies as of 10/29/2023       Reactions   Other Anaphylaxis   Tree Nuts   Shellfish-derived Products Anaphylaxis   Egg-derived Products    Fish Allergy    Peanut Butter Flavoring Agent (non-screening)    Red Dye #40 (allura Red)    Shellfish Allergy         Medication List    AeroChamber w/FLOWSIGnal inhaler 1 each.   albuterol  108 (90 Base) MCG/ACT inhaler Commonly known as: VENTOLIN  HFA Inhale 2 puffs into the lungs every 4 (four) hours as needed for  wheezing or shortness of breath.   amphetamine -dextroamphetamine  20 MG tablet Commonly known as: Adderall Take 1 tablet (20 mg total) by mouth daily with breakfast.   amphetamine -dextroamphetamine  5 MG tablet Commonly known as: Adderall Take 5 mg tablet by mouth with food or snack as needed for afternoon dose.   cetirizine  10 MG tablet Commonly known as: ZyrTEC  Allergy Take 1 tablet (10 mg total) by mouth daily as needed for allergies (Can take an exyra dose during flare ups.).   Cetirizine  HCl Childrens Alrgy 1 MG/ML Soln Generic drug: cetirizine  HCl TAKE 1 TO 2 TEASPOONSFUL BY MOUTH EVERY DAY AS NEEDED   cromolyn  4 % ophthalmic solution Commonly known as: OPTICROM  Place 1 drop into both eyes 4 (four) times daily as needed.   EPINEPHrine  0.3 mg/0.3 mL Soaj injection Commonly known as: EPI-PEN Inject 0.3 mg into the muscle as needed for anaphylaxis. Use as directed for life-threatening allergic reactions.   fluticasone  44 MCG/ACT inhaler Commonly known as: Flovent  HFA USE THREE PUFFS THREE TIMES DAILY FOR FLARE-UP. RINSE, GARGLE, AND SPIT AFTER USE.   fluticasone  50 MCG/ACT nasal spray Commonly known as: FLONASE  USE 1 SPRAY IN EACH NOSTRIL 3-7 TIMES A WEEK AS NEEDED.   montelukast  10 MG tablet Commonly known as: SINGULAIR  TAKE 1 TABLET BY MOUTH EVERYDAY AT BEDTIME   triamcinolone  ointment 0.1 % Commonly known as: KENALOG  Apply 1 application topically daily.    Past Medical History:  Diagnosis Date  Allergic rhinitis    Asthma    Atopic dermatitis    Developmental delay    Food allergy    Peanuts Tree Nuts Fish Shellfish Egg    History reviewed. No pertinent surgical history.  Review of systems negative except as noted in HPI / PMHx or noted below:  Review of Systems  Constitutional: Negative.   HENT: Negative.    Eyes: Negative.   Respiratory: Negative.    Cardiovascular: Negative.   Gastrointestinal: Negative.   Genitourinary: Negative.    Musculoskeletal: Negative.   Skin: Negative.   Neurological: Negative.   Endo/Heme/Allergies: Negative.   Psychiatric/Behavioral: Negative.       Objective:   Vitals:   10/29/23 0857  BP: 118/81  Pulse: 86  Temp: 98.2 F (36.8 C)  SpO2: 100%   Height: 5' 4.75 (164.5 cm)  Weight: 139 lb 8 oz (63.3 kg)   Physical Exam Constitutional:      Appearance: She is not diaphoretic.  HENT:     Head: Normocephalic.     Right Ear: Tympanic membrane, ear canal and external ear normal.     Left Ear: Tympanic membrane, ear canal and external ear normal.     Nose: Nose normal. No mucosal edema or rhinorrhea.     Mouth/Throat:     Pharynx: Uvula midline. No oropharyngeal exudate.  Eyes:     Conjunctiva/sclera: Conjunctivae normal.  Neck:     Thyroid: No thyromegaly.     Trachea: Trachea normal. No tracheal tenderness or tracheal deviation.  Cardiovascular:     Rate and Rhythm: Normal rate and regular rhythm.     Heart sounds: Normal heart sounds, S1 normal and S2 normal. No murmur heard. Pulmonary:     Effort: No respiratory distress.     Breath sounds: Normal breath sounds. No stridor. No wheezing or rales.  Lymphadenopathy:     Head:     Right side of head: No tonsillar adenopathy.     Left side of head: No tonsillar adenopathy.     Cervical: No cervical adenopathy.  Skin:    Findings: No erythema or rash.     Nails: There is no clubbing.  Neurological:     Mental Status: She is alert.     Diagnostics: Spirometry was performed and demonstrated an FEV1 of 2.15 at 75 % of predicted.  Assessment and Plan:   1. Asthma, well controlled, mild persistent   2. Perennial allergic rhinitis   3. Seasonal allergic rhinitis due to pollen   4. Allergic conjunctivitis of both eyes   5. Other atopic dermatitis   6. Food allergy   7. Multiple food allergies   8. Mild intermittent asthma without complication    1. If needed:   A. EpiPen , Benadryl, M.D./ER evaluation for  allergic reaction  B. Zyrtec  10 mg tablet one time per day  C. Albuterol +Fluticasone  44 - 2 inhalations TOGETHER every 6 hours  D. Cromolyn  one drop each eye 1-4 times a day  E. Triamcinolone  0.1%/Eucerin-  applied to eczema 1 time per day  F.  Flonase  - 1-2 sprays each nostril 3-7 times a week   G. Montelukast  10 mg - 1 tablet 1 time a day  2. Return to clinic in 12 months or earlier if problem  3. Obtain fall flu vaccine  4. Influenza = Tamiflu. Covid = Paxlovid  Brianna [name] appears to be doing very well on her current plan of utilizing medications as needed directed against inflammation of her airway, eyes, and  skin.  For the most part as she has aged she has been resolving a large component of her atopic disease.  Her use of symptomatic medications and anti-inflammatory medications for her airway and skin seem to coincide with the springtime season and she has a good understanding about how to use her medications and the appropriate dosing during those times of the year.  Assuming she does well with this plan I will see her back in this clinic in 1 year or earlier if there is a problem.  Camellia Denis, MD Allergy / Immunology [facility]

## 2023-10-31 ENCOUNTER — Encounter: Payer: Self-pay | Admitting: Allergy and Immunology

## 2023-11-09 ENCOUNTER — Other Ambulatory Visit: Payer: Self-pay | Admitting: Family

## 2023-11-09 DIAGNOSIS — F840 Autistic disorder: Secondary | ICD-10-CM

## 2023-11-09 DIAGNOSIS — F909 Attention-deficit hyperactivity disorder, unspecified type: Secondary | ICD-10-CM

## 2023-11-11 MED ORDER — AMPHETAMINE-DEXTROAMPHETAMINE 5 MG PO TABS
ORAL_TABLET | ORAL | 0 refills | Status: DC
Start: 2023-11-11 — End: 2024-03-12

## 2023-11-11 MED ORDER — AMPHETAMINE-DEXTROAMPHETAMINE 20 MG PO TABS
20.0000 mg | ORAL_TABLET | Freq: Every day | ORAL | 0 refills | Status: DC
Start: 2023-11-11 — End: 2024-01-27

## 2024-01-27 ENCOUNTER — Other Ambulatory Visit: Payer: Self-pay | Admitting: Family

## 2024-01-27 DIAGNOSIS — F840 Autistic disorder: Secondary | ICD-10-CM

## 2024-01-27 DIAGNOSIS — F909 Attention-deficit hyperactivity disorder, unspecified type: Secondary | ICD-10-CM

## 2024-01-27 MED ORDER — AMPHETAMINE-DEXTROAMPHETAMINE 20 MG PO TABS: 20.0000 mg | ORAL_TABLET | Freq: Every day | ORAL | 0 refills | Status: DC

## 2024-02-01 ENCOUNTER — Encounter: Payer: Self-pay | Admitting: Family

## 2024-02-12 ENCOUNTER — Telehealth: Payer: Self-pay | Admitting: Pediatrics

## 2024-02-12 NOTE — Telephone Encounter (Signed)
 [name] Request & Authorization for use/disclosure of protected Health Information received 02/12/2024.  Faxed to MR dept 02/12/2024. Sent by courier on 02/13/2024.

## 2024-03-12 ENCOUNTER — Ambulatory Visit (INDEPENDENT_AMBULATORY_CARE_PROVIDER_SITE_OTHER): Payer: MEDICAID | Admitting: Family

## 2024-03-12 ENCOUNTER — Encounter: Payer: Self-pay | Admitting: Family

## 2024-03-12 VITALS — BP 108/66 | HR 64 | Ht 65.0 in | Wt 142.4 lb

## 2024-03-12 DIAGNOSIS — F902 Attention-deficit hyperactivity disorder, combined type: Secondary | ICD-10-CM

## 2024-03-12 DIAGNOSIS — Z113 Encounter for screening for infections with a predominantly sexual mode of transmission: Secondary | ICD-10-CM

## 2024-03-12 DIAGNOSIS — F840 Autistic disorder: Secondary | ICD-10-CM | POA: Diagnosis not present

## 2024-03-12 DIAGNOSIS — Z3202 Encounter for pregnancy test, result negative: Secondary | ICD-10-CM

## 2024-03-12 LAB — POCT URINE PREGNANCY: Preg Test, Ur: NEGATIVE

## 2024-03-12 MED ORDER — AMPHETAMINE-DEXTROAMPHETAMINE 5 MG PO TABS: ORAL_TABLET | ORAL | 0 refills | Status: DC

## 2024-03-12 MED ORDER — AMPHETAMINE-DEXTROAMPHETAMINE 20 MG PO TABS: 20.0000 mg | ORAL_TABLET | Freq: Every day | ORAL | 0 refills | Status: DC

## 2024-03-12 NOTE — Progress Notes (Addendum)
 History was provided by the patient.  Brianna [name] is a 18 y.o. female who is here for ADHD, combined type, ASD.   PCP confirmed? Yes.    Brianna Chiles, MD  Plan from last visit 08/09/23  1. Attention deficit disorder with hyperactivity 2. Autism spectrum disorder   -continue with Adderall XR 20 mg daily in morning and Adderall 5 mg for afternoons as needed -reviewed return precautions  -return 3 months or sooner if needed   -weight and vitals update at next check-up.    - amphetamine -dextroamphetamine  (ADDERALL) 20 MG tablet; Take 1 tablet (20 mg total) by mouth daily with breakfast.  Dispense: 30 tablet; Refill: 0 - amphetamine -dextroamphetamine  (ADDERALL) 5 MG tablet; Take 5 mg tablet by mouth with food or snack as needed for afternoon dose.  Dispense: 30 tablet; Refill: 0    Pertinent Labs:  Urine gc/c negative 07/18/23  HIV non-reactive 07/18/23  Chart/Growth Chart Review: stable   HPI:   -put on vocational rehab; college didn't work out  -hasn't started yet; medical records requested  -will take anywhere from 4-6 weeks  -will sometimes take the afternoon dose but will sometimes get headaches; does not drink a lot of water -feels like she is more focused when she takes it  -eye dr last week said she only needed to wear when needed, like watching TV   ADHD Medication Side Effects: Sleep problems: in between waking rested and waking tired; does not wake during the night  Loss of appetite: no Abdominal pain: no Headache: as per HPI  Irritability: no Dizziness: no Heart Palpitations: no Tics: no   Brianna [name] notes she is nervous in the kitchen with her; Brianna [name] is trying to teach her but notices that she is more nervous around her than others, for example she is talkative at church etc    LMP every month, end of April; no cramping; bleeds about 5 days; no issue with bleeding      03/12/2024    9:17 AM 07/06/2022    6:32 PM 05/24/2022   11:46 AM  PHQ-SADS Last 3  Score only  PHQ-15 Score 0  0  Total GAD-7 Score 0  0  PHQ Adolescent Score 0 0 0   ASRS Completed on 03/12/24 Part A:  0/6 Part B:  0/12   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   Food allergy 06/18/2015   Asthma, mild persistent 07/08/2014   Seasonal allergic rhinitis 07/08/2014   Eczema 07/08/2014   Bunion 07/08/2014   ADHD (attention deficit hyperactivity disorder), combined type 08/17/2013   Learning disability 08/17/2013   Language disorder involving understanding and expression of language 08/17/2013   Encopresis 07/06/2013   Adopted 07/06/2013    Current Outpatient Medications on File Prior to Visit  Medication Sig Dispense Refill   albuterol  (VENTOLIN  HFA) 108 (90 Base) MCG/ACT inhaler Inhale 2 puffs into the lungs every 4 (four) hours as needed for wheezing or shortness of breath. 1 each 2   amphetamine -dextroamphetamine  (ADDERALL) 20 MG tablet Take 1 tablet (20 mg total) by mouth daily with breakfast. 30 tablet 0   amphetamine -dextroamphetamine  (ADDERALL) 5 MG tablet Take 5 mg tablet by mouth with food or snack as needed for afternoon dose. 30 tablet 0   cetirizine  (ZYRTEC  ALLERGY) 10 MG tablet  Take 1 tablet (10 mg total) by mouth daily as needed for allergies (Can take an exyra dose during flare ups.). 180 tablet 3   CETIRIZINE  HCL CHILDRENS ALRGY 1 MG/ML SOLN TAKE 1 TO 2 TEASPOONSFUL BY MOUTH EVERY DAY AS NEEDED 300 mL 0   cromolyn  (OPTICROM ) 4 % ophthalmic solution Place 1 drop into both eyes 4 (four) times daily as needed. 30 mL 3   EPINEPHrine  0.3 mg/0.3 mL IJ SOAJ injection Inject 0.3 mg into the muscle as needed for anaphylaxis. Use as directed for life-threatening allergic reactions. 4 each 2   fluticasone  (FLONASE ) 50 MCG/ACT nasal spray Place 1  spray into both nostrils daily. Use 1 spray in each nostril 3-7 times a week as needed. 48 mL 3   fluticasone  (FLOVENT  HFA) 44 MCG/ACT inhaler USE THREE PUFFS THREE TIMES DAILY FOR FLARE-UP. RINSE, GARGLE, AND SPIT AFTER USE. 10.6 each 5   montelukast  (SINGULAIR ) 10 MG tablet Take 1 tablet (10 mg total) by mouth at bedtime. TAKE 1 TABLET BY MOUTH EVERYDAY AT BEDTIME 90 tablet 3   Spacer/Aero-Holding Chambers (AEROCHAMBER W/FLOWSIGNAL) inhaler 1 each.     triamcinolone  ointment (KENALOG ) 0.1 % Apply 1 Application topically daily. 453.6 g 1   No current facility-administered medications on file prior to visit.    Allergies  Allergen Reactions   Other Anaphylaxis    Tree Nuts   Shellfish-Derived Products Anaphylaxis   Egg-Derived Products    Fish Allergy    Peanut Butter Flavoring Agent (Non-Screening)    Red Dye #40 (Allura Red)    Shellfish Allergy     Physical Exam:    Vitals:   03/12/24 0837  BP: 108/66  Pulse: 64  Weight: 142 lb 6.4 oz (64.6 kg)  Height: 5\' 5"  (1.651 m)   Wt Readings from Last 3 Encounters:  03/12/24 142 lb 6.4 oz (64.6 kg) (72%, Z= 0.59)*  10/29/23 139 lb 8 oz (63.3 kg) (70%, Z= 0.51)*  07/18/23 137 lb 9.6 oz (62.4 kg) (68%, Z= 0.47)*   * Growth percentiles are based on CDC (Girls, 2-20 Years) data.     Blood pressure %iles are not available for patients who are 18 years or older.   Physical Exam Constitutional:      General: She is not in acute distress.    Appearance: She is well-developed.  HENT:     Head: Normocephalic and atraumatic.  Eyes:     General: No scleral icterus.    Pupils: Pupils are equal, round, and reactive to light.  Neck:     Thyroid: No thyromegaly.  Cardiovascular:     Rate and Rhythm: Normal rate and regular rhythm.     Heart sounds: Normal heart sounds. No murmur heard. Pulmonary:     Effort: Pulmonary effort is normal.     Breath sounds: Normal breath sounds.  Abdominal:     Palpations: Abdomen is soft.   Musculoskeletal:        General: Normal range of motion.     Cervical back: Normal range of motion and neck supple.  Lymphadenopathy:     Cervical: No cervical adenopathy.  Skin:    General: Skin is warm and dry.     Capillary Refill: Capillary refill takes less than 2 seconds.     Findings: No rash.  Neurological:     General: No focal deficit present.     Mental Status: She is alert and oriented to person, place, and time.     Cranial Nerves: No  cranial nerve deficit.     Motor: No tremor.  Psychiatric:        Attention and Perception: Attention normal.        Mood and Affect: Mood normal.        Speech: Speech normal.        Behavior: Behavior normal.        Thought Content: Thought content normal.        Judgment: Judgment normal.     Assessment/Plan:  1. ADHD (attention deficit hyperactivity disorder), combined type (Primary) 2. Autism spectrum disorder -screenings negative today; continue with Adderall XR 20 mg for morning dose; recommended increased water intake to avoid dehydration headaches 2/2 stimulant use; return precautions reviewed  -check in 2 months or sooner if needed to see how dosing is working with vocational program once initiated   - amphetamine -dextroamphetamine  (ADDERALL) 20 MG tablet; Take 1 tablet (20 mg total) by mouth daily with breakfast.  Dispense: 30 tablet; Refill: 0 - amphetamine -dextroamphetamine  (ADDERALL) 5 MG tablet; Take 5 mg tablet by mouth with food or snack as needed for afternoon dose.  Dispense: 30 tablet; Refill: 0  3. Pregnancy examination or test, negative result -negative - POCT urine pregnancy  4. Routine screening for STI (sexually transmitted infection) - C. trachomatis/N. gonorrhoeae RNA

## 2024-03-13 LAB — C. TRACHOMATIS/N. GONORRHOEAE RNA
C. trachomatis RNA, TMA: NOT DETECTED
N. gonorrhoeae RNA, TMA: NOT DETECTED

## 2024-05-13 ENCOUNTER — Telehealth: Payer: MEDICAID | Admitting: Family

## 2024-05-22 ENCOUNTER — Encounter: Payer: Self-pay | Admitting: Family

## 2024-05-22 ENCOUNTER — Telehealth: Payer: MEDICAID | Admitting: Family

## 2024-05-22 DIAGNOSIS — F902 Attention-deficit hyperactivity disorder, combined type: Secondary | ICD-10-CM | POA: Diagnosis not present

## 2024-05-22 DIAGNOSIS — F840 Autistic disorder: Secondary | ICD-10-CM

## 2024-05-22 MED ORDER — AMPHETAMINE-DEXTROAMPHETAMINE 5 MG PO TABS
ORAL_TABLET | ORAL | 0 refills | Status: AC
Start: 2024-05-22 — End: ?

## 2024-05-22 MED ORDER — AMPHETAMINE-DEXTROAMPHETAMINE 20 MG PO TABS: 20.0000 mg | ORAL_TABLET | Freq: Every day | ORAL | 0 refills | Status: DC

## 2024-05-22 NOTE — Progress Notes (Signed)
 THIS RECORD MAY CONTAIN CONFIDENTIAL INFORMATION THAT SHOULD NOT BE RELEASED WITHOUT REVIEW OF THE SERVICE PROVIDER.  Virtual Follow-Up Visit via Video Note  I connected with Brianna [name]  on 05/22/24 at  8:30 AM EDT by a video enabled telemedicine application and verified that I am speaking with the correct person using two identifiers.   Patient/parent location: home, with mother in background briefly Provider location: remote, [place]   I discussed the limitations of evaluation and management by telemedicine and the availability of in person appointments.  I discussed that the purpose of this telehealth visit is to provide medical care while limiting exposure to the novel coronavirus.  The patient expressed understanding and agreed to proceed.   Brianna [name] is a 18 y.o. female referred by Taft Jon PARAS, MD here today for follow-up of ADHD, combined type.   History was provided by the patient.  Supervising Physician: Dr. Kreg Helena   Plan from Last Visit:   1. ADHD (attention deficit hyperactivity disorder), combined type (Primary) 2. Autism spectrum disorder -screenings negative today; continue with Adderall XR 20 mg for morning dose; recommended increased water intake to avoid dehydration headaches 2/2 stimulant use; return precautions reviewed  -check in 2 months or sooner if needed to see how dosing is working with vocational program once initiated    - amphetamine -dextroamphetamine  (ADDERALL) 20 MG tablet; Take 1 tablet (20 mg total) by mouth daily with breakfast.  Dispense: 30 tablet; Refill: 0 - amphetamine -dextroamphetamine  (ADDERALL) 5 MG tablet; Take 5 mg tablet by mouth with food or snack as needed for afternoon dose.  Dispense: 30 tablet; Refill: 0   3. Pregnancy examination or test, negative result -negative - POCT urine pregnancy   4. Routine screening for STI (sexually transmitted infection) - C. trachomatis/N. gonorrhoeae RNA  Chief Complaint: No  concerns, just need refills   History of Present Illness:   -summer going well, no concerns  -wakes around 9AM, takes medicine, washes up, breakfast -chores completed; takes 2nd dose around 3PM  -no concerns  ADHD Medication Side Effects: Sleep problems: no Loss of appetite: no Abdominal pain: no Headache: no Irritability: no Dizziness: no Heart Palpitations: no Tics: no   Allergies  Allergen Reactions   Other Anaphylaxis    Tree Nuts   Shellfish-Derived Products Anaphylaxis   Egg-Derived Products    Fish Allergy    Peanut Butter Flavoring Agent (Non-Screening)    Red Dye #40 (Allura Red)    Shellfish Allergy    Outpatient Medications Prior to Visit  Medication Sig Dispense Refill   albuterol  (VENTOLIN  HFA) 108 (90 Base) MCG/ACT inhaler Inhale 2 puffs into the lungs every 4 (four) hours as needed for wheezing or shortness of breath. 1 each 2   amphetamine -dextroamphetamine  (ADDERALL) 20 MG tablet Take 1 tablet (20 mg total) by mouth daily with breakfast. 30 tablet 0   amphetamine -dextroamphetamine  (ADDERALL) 5 MG tablet Take 5 mg tablet by mouth with food or snack as needed for afternoon dose. 30 tablet 0   cetirizine  (ZYRTEC  ALLERGY) 10 MG tablet Take 1 tablet (10 mg total) by mouth daily as needed for allergies (Can take an exyra dose during flare ups.). 180 tablet 3   CETIRIZINE  HCL CHILDRENS ALRGY 1 MG/ML SOLN TAKE 1 TO 2 TEASPOONSFUL BY MOUTH EVERY DAY AS NEEDED 300 mL 0   cromolyn  (OPTICROM ) 4 % ophthalmic solution Place 1 drop into both eyes 4 (four) times daily as needed. 30 mL 3   EPINEPHrine  0.3 mg/0.3 mL  IJ SOAJ injection Inject 0.3 mg into the muscle as needed for anaphylaxis. Use as directed for life-threatening allergic reactions. 4 each 2   fluticasone  (FLONASE ) 50 MCG/ACT nasal spray Place 1 spray into both nostrils daily. Use 1 spray in each nostril 3-7 times a week as needed. 48 mL 3   fluticasone  (FLOVENT  HFA) 44 MCG/ACT inhaler USE THREE PUFFS THREE TIMES  DAILY FOR FLARE-UP. RINSE, GARGLE, AND SPIT AFTER USE. 10.6 each 5   montelukast  (SINGULAIR ) 10 MG tablet Take 1 tablet (10 mg total) by mouth at bedtime. TAKE 1 TABLET BY MOUTH EVERYDAY AT BEDTIME 90 tablet 3   Spacer/Aero-Holding Chambers (AEROCHAMBER W/FLOWSIGNAL) inhaler 1 each.     triamcinolone  ointment (KENALOG ) 0.1 % Apply 1 Application topically daily. 453.6 g 1   No facility-administered medications prior to visit.     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   Food allergy 06/18/2015   Asthma, mild persistent 07/08/2014   Seasonal allergic rhinitis 07/08/2014   Eczema 07/08/2014   Bunion 07/08/2014   ADHD (attention deficit hyperactivity disorder), combined type 08/17/2013   Learning disability 08/17/2013   Language disorder involving understanding and expression of language 08/17/2013   Encopresis 07/06/2013   Adopted 07/06/2013   The following portions of the patient's history were reviewed and updated as appropriate: allergies, current medications, past family history, past medical history, past social history, past surgical history, and problem list.  Visual Observations/Objective:   General Appearance: Well nourished well developed, in no apparent distress.  Eyes: conjunctiva no swelling or erythema ENT/Mouth: No hoarseness, No cough for duration of visit.  Neck: Supple  Respiratory: Respiratory effort normal, normal rate, no retractions or distress.   Cardio: Appears well-perfused, noncyanotic Musculoskeletal: no obvious deformity Skin: visible skin without rashes, ecchymosis, erythema Neuro: Awake and oriented X 3,  Psych:  normal affect, Insight and Judgment appropriate.    Assessment/Plan: 1. ADHD (attention deficit hyperactivity disorder),  combined type (Primary) 2. Autism spectrum disorder  -stable on current regimen; report new or worsening  -return in 3 months for in-person follow-up with vitals update, PHQSADS and ASRS at that time  - amphetamine -dextroamphetamine  (ADDERALL) 20 MG tablet; Take 1 tablet (20 mg total) by mouth daily with breakfast.  Dispense: 30 tablet; Refill: 0 - amphetamine -dextroamphetamine  (ADDERALL) 5 MG tablet; Take 5 mg tablet by mouth with food or snack as needed for afternoon dose.  Dispense: 30 tablet; Refill: 0  I discussed the assessment and treatment plan with the patient and/or parent/guardian.  They were provided an opportunity to ask questions and all were answered.  They agreed with the plan and demonstrated an understanding of the instructions. They were advised to call back or seek an in-person evaluation in the emergency room if the symptoms worsen or if the condition fails to improve as anticipated.   Follow-up:   3 MONTHS IN PERSON    Bari CHRISTELLA Molt, NP    CC: Taft Jon PARAS, MD, Taft Jon PARAS, MD

## 2024-06-30 ENCOUNTER — Encounter: Payer: Self-pay | Admitting: Family

## 2024-07-09 ENCOUNTER — Other Ambulatory Visit (INDEPENDENT_AMBULATORY_CARE_PROVIDER_SITE_OTHER): Payer: MEDICAID | Admitting: Family

## 2024-07-09 DIAGNOSIS — F840 Autistic disorder: Secondary | ICD-10-CM

## 2024-07-09 DIAGNOSIS — F902 Attention-deficit hyperactivity disorder, combined type: Secondary | ICD-10-CM | POA: Diagnosis not present

## 2024-07-09 MED ORDER — AMPHETAMINE-DEXTROAMPHETAMINE 20 MG PO TABS: 20.0000 mg | ORAL_TABLET | Freq: Every day | ORAL | 0 refills | Status: DC

## 2024-07-09 NOTE — Progress Notes (Signed)
PDMP reviewed, refill sent per request.

## 2024-08-26 ENCOUNTER — Other Ambulatory Visit: Payer: Self-pay | Admitting: Allergy and Immunology

## 2024-08-26 ENCOUNTER — Other Ambulatory Visit: Payer: Self-pay | Admitting: Family

## 2024-08-26 ENCOUNTER — Encounter: Payer: Self-pay | Admitting: Family

## 2024-08-26 DIAGNOSIS — F840 Autistic disorder: Secondary | ICD-10-CM

## 2024-08-26 DIAGNOSIS — F902 Attention-deficit hyperactivity disorder, combined type: Secondary | ICD-10-CM

## 2024-08-26 MED ORDER — AMPHETAMINE-DEXTROAMPHETAMINE 20 MG PO TABS: 20.0000 mg | ORAL_TABLET | Freq: Every day | ORAL | 0 refills | Status: DC

## 2024-08-26 NOTE — Progress Notes (Signed)
Refill sent. PDMP reviewed.

## 2024-10-27 ENCOUNTER — Encounter: Payer: Self-pay | Admitting: Allergy and Immunology

## 2024-10-27 ENCOUNTER — Ambulatory Visit (INDEPENDENT_AMBULATORY_CARE_PROVIDER_SITE_OTHER): Payer: MEDICAID | Admitting: Allergy and Immunology

## 2024-10-27 ENCOUNTER — Other Ambulatory Visit: Payer: Self-pay

## 2024-10-27 VITALS — BP 114/80 | HR 62 | Temp 98.3°F | Resp 16 | Ht 65.13 in | Wt 156.2 lb

## 2024-10-27 DIAGNOSIS — J3089 Other allergic rhinitis: Secondary | ICD-10-CM

## 2024-10-27 DIAGNOSIS — Z91018 Allergy to other foods: Secondary | ICD-10-CM | POA: Diagnosis not present

## 2024-10-27 DIAGNOSIS — J4530 Mild persistent asthma, uncomplicated: Secondary | ICD-10-CM | POA: Diagnosis not present

## 2024-10-27 DIAGNOSIS — L2089 Other atopic dermatitis: Secondary | ICD-10-CM

## 2024-10-27 DIAGNOSIS — J301 Allergic rhinitis due to pollen: Secondary | ICD-10-CM | POA: Diagnosis not present

## 2024-10-27 MED ORDER — MONTELUKAST SODIUM 10 MG PO TABS: 10.0000 mg | ORAL_TABLET | Freq: Every evening | ORAL | 3 refills | Status: AC

## 2024-10-27 MED ORDER — EPINEPHRINE 0.3 MG/0.3ML IJ SOAJ: 0.3000 mg | INTRAMUSCULAR | 2 refills | Status: AC | PRN

## 2024-10-27 MED ORDER — ALBUTEROL SULFATE HFA 108 (90 BASE) MCG/ACT IN AERS: 2.0000 | INHALATION_SPRAY | RESPIRATORY_TRACT | 2 refills | Status: AC | PRN

## 2024-10-27 MED ORDER — TRIAMCINOLONE ACETONIDE 0.1 % EX OINT: 1.0000 | TOPICAL_OINTMENT | Freq: Every day | CUTANEOUS | 1 refills | Status: AC

## 2024-10-27 MED ORDER — CETIRIZINE HCL 10 MG PO TABS: 10.0000 mg | ORAL_TABLET | Freq: Every day | ORAL | 3 refills | Status: AC | PRN

## 2024-10-27 MED ORDER — FLUTICASONE PROPIONATE 50 MCG/ACT NA SUSP: 1.0000 | Freq: Every day | NASAL | 5 refills | Status: AC

## 2024-10-27 MED ORDER — FLUTICASONE PROPIONATE HFA 44 MCG/ACT IN AERO: INHALATION_SPRAY | RESPIRATORY_TRACT | 11 refills | Status: AC

## 2024-10-27 NOTE — Patient Instructions (Signed)
" °  1. If needed:   A. EpiPen , Benadryl, M.D./ER evaluation for allergic reaction  B. Zyrtec  10 mg tablet one time per day  C. Albuterol +Fluticasone  44 - 2 inhalations TOGETHER every 6 hours  D. Cromolyn  one drop each eye 1-4 times a day  E. Triamcinolone  0.1%/Eucerin-  applied to eczema 1 time per day  F.  Flonase  - 1-2 sprays each nostril 3-7 times a week   2. Return to clinic in 12 months or earlier if problem  3. Influenza = Tamiflu. Covid = Paxlovid    "

## 2024-10-27 NOTE — Progress Notes (Unsigned)
 "  [place] [facility] [place] - [place] - [place] - [place]   Follow-up Note  Referring Provider: Taft Jon PARAS, MD Primary Provider: Taft Jon PARAS, MD Date of Office Visit: 10/27/2024  Subjective:   Brianna [name] (DOB: 2006-09-20) is a 18 y.o. female who [facility] in re-evaluation of the following:  HPI: Brianna [name] returns to this clinic in evaluation of asthma, allergic rhinitis, atopic dermatitis, food allergy directed against tree nut, peanut, shellfish.  I last saw her in this clinic 29 October 2023.  Her asthma has been relatively nonexistent and she rarely uses a short acting bronchodilator or Flovent  combination.  She can exercise without any problem and she can have cold air exposure without any problem and she has not required a systemic steroid to treat an exacerbation.  She does not use any controller agents at this point.  Her upper airway disease is very good with intermittent and rare use of Flonase  and some Zyrtec .  She has not required an antibiotic to treat an episode of sinusitis.  Her atopic dermatitis has not really been causing her much problem and she intermittently and rarely uses any topical steroids.  She remains away from consumption of tree nut, peanut, and shellfish.  She does not receive the flu vaccine.  Allergies as of 10/27/2024       Reactions   Other Anaphylaxis   Tree Nuts   Shellfish Protein-containing Drug Products Anaphylaxis   Egg Protein-containing Drug Products    Fish Allergy    Peanut Butter Flavoring Agent (non-screening)    Red Dye #40 (allura Red)    Shellfish Allergy         Medication List    AeroChamber w/FLOWSIGnal inhaler 1 each.   albuterol  108 (90 Base) MCG/ACT inhaler Commonly known as: VENTOLIN  HFA Inhale 2 puffs into the lungs every 4 (four) hours as needed for wheezing or shortness of breath.   amphetamine -dextroamphetamine  5 MG tablet Commonly known as:  Adderall Take 5 mg tablet by mouth with food or snack as needed for afternoon dose.   amphetamine -dextroamphetamine  20 MG tablet Commonly known as: Adderall Take 1 tablet (20 mg total) by mouth daily with breakfast.   cetirizine  10 MG tablet Commonly known as: ZyrTEC  Allergy Take 1 tablet (10 mg total) by mouth daily as needed for allergies (Can take an exyra dose during flare ups.).   cromolyn  4 % ophthalmic solution Commonly known as: OPTICROM  Place 1 drop into both eyes 4 (four) times daily as needed.   EPINEPHrine  0.3 mg/0.3 mL Soaj injection Commonly known as: EPI-PEN Inject 0.3 mg into the muscle as needed for anaphylaxis. Use as directed for life-threatening allergic reactions.   fluticasone  44 MCG/ACT inhaler Commonly known as: Flovent  HFA USE THREE PUFFS THREE TIMES DAILY FOR FLARE-UP. RINSE, GARGLE, AND SPIT AFTER USE.   fluticasone  50 MCG/ACT nasal spray Commonly known as: FLONASE  Place 1 spray into both nostrils daily. Use 1 spray in each nostril 3-7 times a week as needed.   montelukast  10 MG tablet Commonly known as: SINGULAIR  Take 1 tablet (10 mg total) by mouth at bedtime. TAKE 1 TABLET BY MOUTH EVERYDAY AT BEDTIME   triamcinolone  ointment 0.1 % Commonly known as: KENALOG  APPLY 1 APPLICATION TOPICALLY DAILY    Past Medical History:  Diagnosis Date   Allergic rhinitis    Asthma    Atopic dermatitis    Developmental delay    Food allergy    Peanuts  Tree Nuts Fish Shellfish Egg    History reviewed. No pertinent surgical history.  Review of systems negative except as noted in HPI / PMHx or noted below:  Review of Systems  Constitutional: Negative.   HENT: Negative.    Eyes: Negative.   Respiratory: Negative.    Cardiovascular: Negative.   Gastrointestinal: Negative.   Genitourinary: Negative.   Musculoskeletal: Negative.   Skin: Negative.   Neurological: Negative.   Endo/Heme/Allergies: Negative.   Psychiatric/Behavioral: Negative.        Objective:   Vitals:   10/27/24 1457  BP: 114/80  Pulse: 62  Resp: 16  Temp: 98.3 F (36.8 C)  SpO2: 99%   Height: 5' 5.13 (165.4 cm)  Weight: 156 lb 3.2 oz (70.9 kg)   Physical Exam Constitutional:      Appearance: She is not diaphoretic.  HENT:     Head: Normocephalic.     Right Ear: Tympanic membrane, ear canal and external ear normal.     Left Ear: Tympanic membrane, ear canal and external ear normal.     Nose: Nose normal. No mucosal edema or rhinorrhea.     Mouth/Throat:     Pharynx: Uvula midline. No oropharyngeal exudate.  Eyes:     Conjunctiva/sclera: Conjunctivae normal.  Neck:     Thyroid: No thyromegaly.     Trachea: Trachea normal. No tracheal tenderness or tracheal deviation.  Cardiovascular:     Rate and Rhythm: Normal rate and regular rhythm.     Heart sounds: Normal heart sounds, S1 normal and S2 normal. No murmur heard. Pulmonary:     Effort: No respiratory distress.     Breath sounds: Normal breath sounds. No stridor. No wheezing or rales.  Lymphadenopathy:     Head:     Right side of head: No tonsillar adenopathy.     Left side of head: No tonsillar adenopathy.     Cervical: No cervical adenopathy.  Skin:    Findings: No erythema or rash.     Nails: There is no clubbing.  Neurological:     Mental Status: She is alert.     Diagnostics: Spirometry was performed and demonstrated an FEV1 of 2.42 at 81 % of predicted.  Assessment and Plan:   1. Asthma, well controlled, mild persistent   2. Other atopic dermatitis   3. Perennial allergic rhinitis   4. Seasonal allergic rhinitis due to pollen   5. Food allergy    1. If needed:   A. EpiPen , Benadryl, M.D./ER evaluation for allergic reaction  B. Zyrtec  10 mg tablet one time per day  C. Albuterol +Fluticasone  44 - 2 inhalations TOGETHER every 6 hours  D. Cromolyn  one drop each eye 1-4 times a day  E. Triamcinolone  0.1%/Eucerin-  applied to eczema 1 time per day  F.  Flonase  - 1-2  sprays each nostril 3-7 times a week   2. Return to clinic in 12 months or earlier if problem  3. Influenza = Tamiflu. Covid = Paxlovid  Brianna [name] has really done well as she has aged and her multiorgan atopic disease is modified significantly to the point where she only uses her medications as needed at this point in time.  She understands not to eat peanuts and tree nuts and shellfish.  She understands when to use albuterol  and fluticasone  as an anti-inflammatory rescue medicine and her antihistamines and eyedrops and topical steroids and nasal steroids.  Assuming she does well with this plan I will see her back in this clinic  in 1 year or earlier if there is a problem.  Camellia Denis, MD Allergy / Immunology [facility] "

## 2024-10-28 ENCOUNTER — Encounter: Payer: Self-pay | Admitting: Allergy and Immunology

## 2024-11-09 ENCOUNTER — Other Ambulatory Visit: Payer: Self-pay | Admitting: Family

## 2024-11-09 ENCOUNTER — Encounter: Payer: Self-pay | Admitting: Family

## 2024-11-09 DIAGNOSIS — F902 Attention-deficit hyperactivity disorder, combined type: Secondary | ICD-10-CM

## 2024-11-09 DIAGNOSIS — F840 Autistic disorder: Secondary | ICD-10-CM

## 2024-11-09 MED ORDER — AMPHETAMINE-DEXTROAMPHETAMINE 20 MG PO TABS: 20.0000 mg | ORAL_TABLET | Freq: Every day | ORAL | 0 refills | Status: AC

## 2024-11-09 NOTE — Progress Notes (Signed)
 Refill request via My Chart. PDMP reviewed. Refill sent.

## 2025-10-26 ENCOUNTER — Ambulatory Visit: Payer: MEDICAID | Admitting: Allergy and Immunology
